# Patient Record
Sex: Male | Born: 1960 | Hispanic: No | Smoking: Never smoker
Health system: Southern US, Community
[De-identification: ages and names within clinical notes are randomized; demographics above are authoritative.]

## PROBLEM LIST (undated history)

## (undated) DIAGNOSIS — K649 Unspecified hemorrhoids: Secondary | ICD-10-CM

## (undated) HISTORY — DX: Unspecified hemorrhoids: K64.9

---

## 2009-09-30 ENCOUNTER — Emergency Department (HOSPITAL_COMMUNITY): Admission: EM | Admit: 2009-09-30 | Discharge: 2009-09-30 | Payer: Self-pay | Admitting: Emergency Medicine

## 2010-09-20 LAB — BASIC METABOLIC PANEL
BUN: 9 mg/dL (ref 6–23)
CO2: 28 mEq/L (ref 19–32)
Chloride: 106 mEq/L (ref 96–112)
GFR calc Af Amer: >60 mL/min (ref >60)
GFR calc non Af Amer: >60 mL/min (ref >60)
Glucose, Bld: 112 mg/dL — ABNORMAL HIGH (ref 70–99)
Potassium: 4.2 mEq/L (ref 3.5–5.1)
Sodium: 139 mEq/L (ref 135–145)

## 2010-09-20 LAB — DIFFERENTIAL
Basophils Relative: 1 % (ref 0–1)
Lymphocytes Relative: 33 % (ref 12–46)
Lymphs Abs: 0.9 10*3/uL (ref 0.7–4.0)
Monocytes Absolute: 0.4 10*3/uL (ref 0.1–1.0)
Monocytes Relative: 13 % — ABNORMAL HIGH (ref 3–12)
Neutro Abs: 1.4 10*3/uL — ABNORMAL LOW (ref 1.7–7.7)
Neutrophils Relative %: 50 % (ref 43–77)

## 2010-09-20 LAB — CBC
HCT: 39.7 % (ref 39.0–52.0)
Hemoglobin: 13.3 g/dL (ref 13.0–17.0)
MCV: 85.7 fL (ref 78.0–100.0)
RDW: 13.2 % (ref 11.5–15.5)
WBC: 2.8 10*3/uL — ABNORMAL LOW (ref 4.0–10.5)

## 2010-09-20 LAB — CK TOTAL AND CKMB (NOT AT ARMC): Relative Index: 0.8 (ref 0.0–2.5)

## 2011-08-03 IMAGING — CT CT ANGIO CHEST
2 of 6 series · 19 of 36 positions shown · IV contrast (APPLIED)
Comparison: None

CLINICAL DATA: Chest pain, question aortic dissection or aneurysm

CT ANGIOGRAPHY CHEST WITH CONTRAST
TECHNIQUE: Multidetector CT imaging of the chest was performed
using the standard protocol during bolus administration of
intravenous contrast.  Multiplanar CT image reconstructions
including MIPs were obtained to evaluate the vascular anatomy.
Breast shield utilized.
Contrast:  100 ml [id number] IV

[Series 8: pulm embolism 1.0 b25f thins · axial · 0.63mm/px · z∈[+1027,+1354]mm · 18 of 365 slices shown]
[im 19/365  lung]
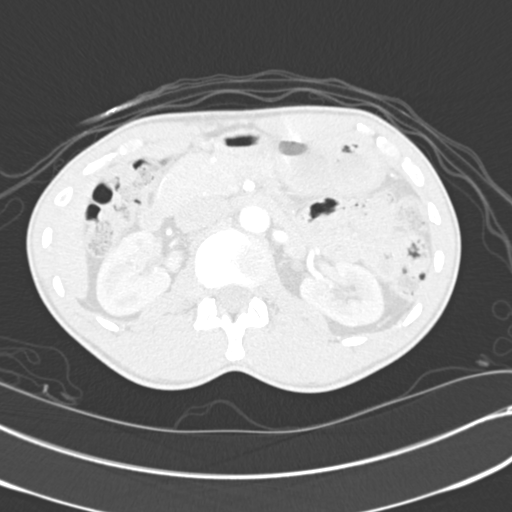
[im 37/365  mediastinal]
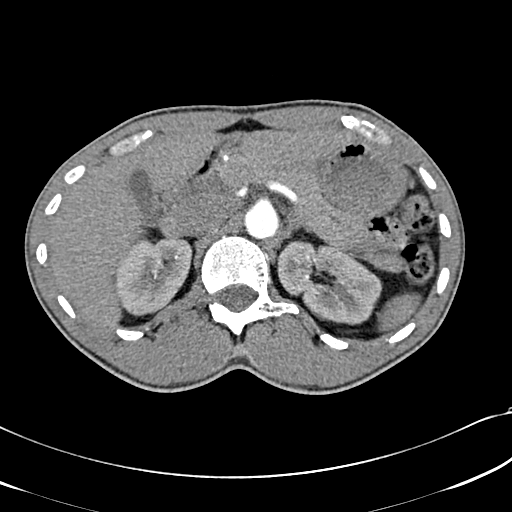
[im 55/365  lung]
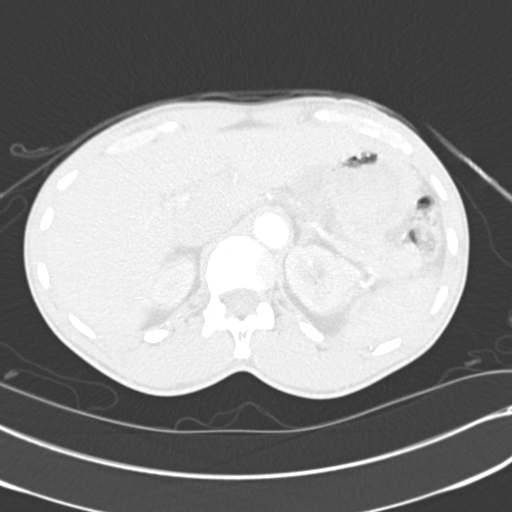
[im 73/365  mediastinal]
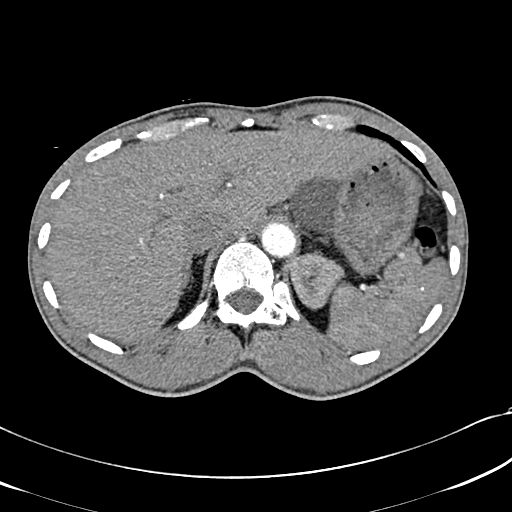
[im 92/365  lung]
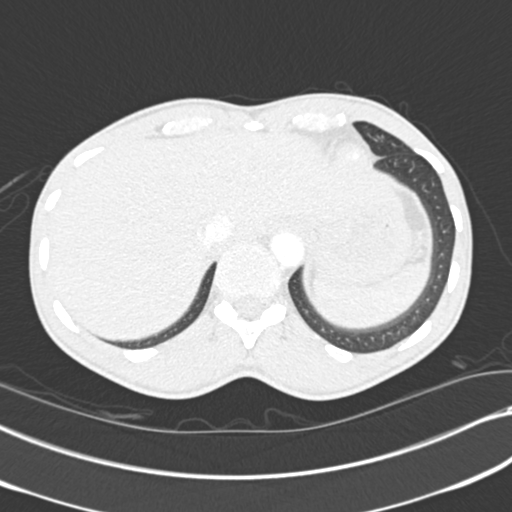
[im 110/365  mediastinal]
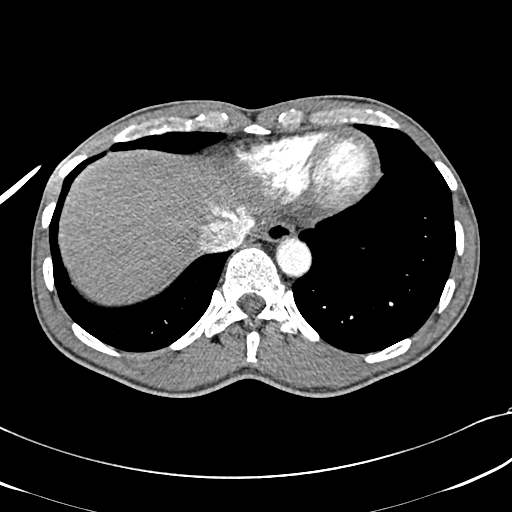
[im 128/365  lung]
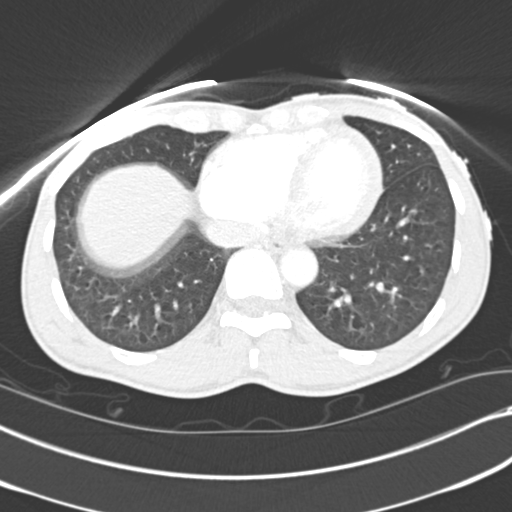
[im 146/365  mediastinal]
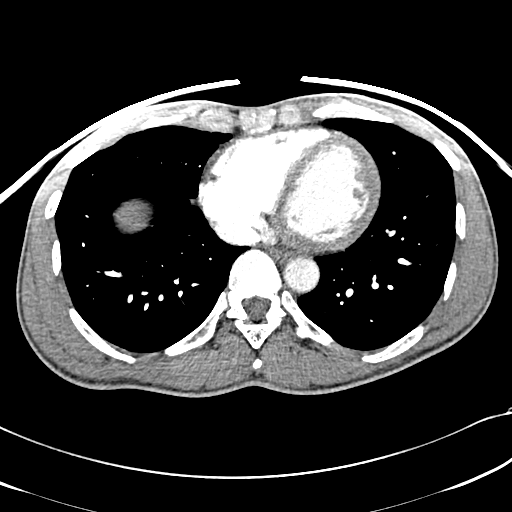
[im 164/365  lung]
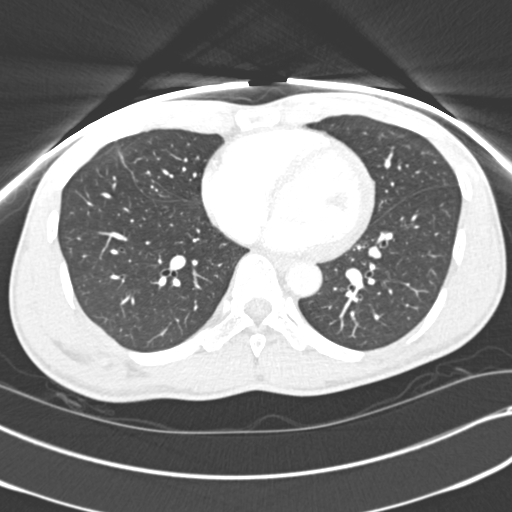
[im 201/365  mediastinal]
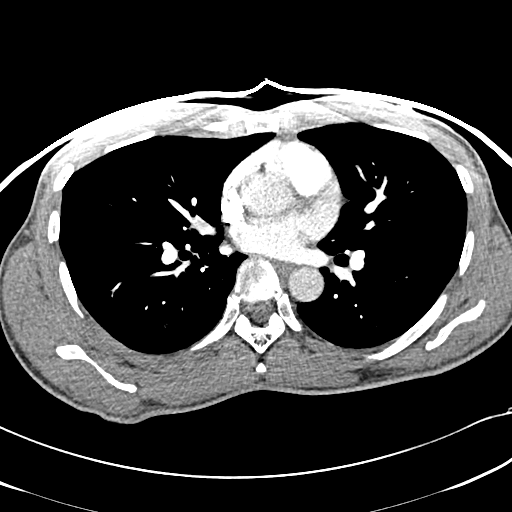
[im 219/365  lung]
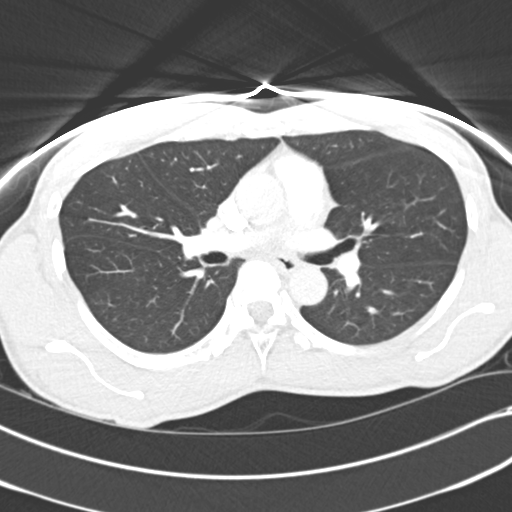
[im 237/365  mediastinal]
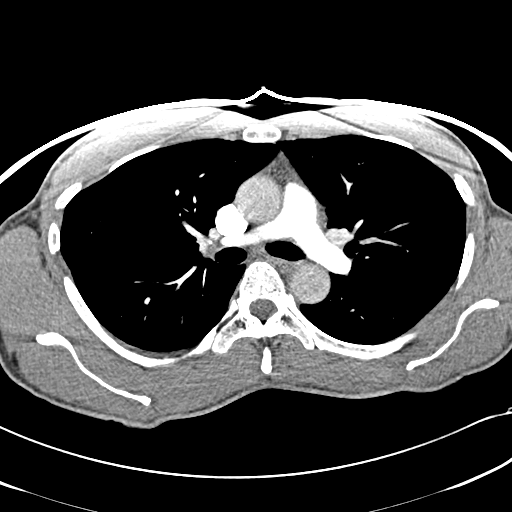
[im 255/365  lung]
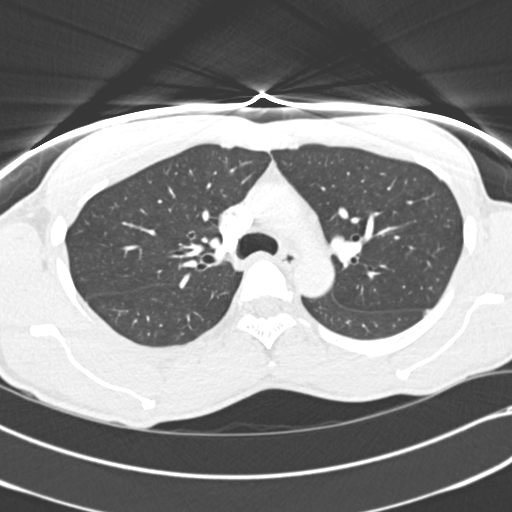
[im 274/365  mediastinal]
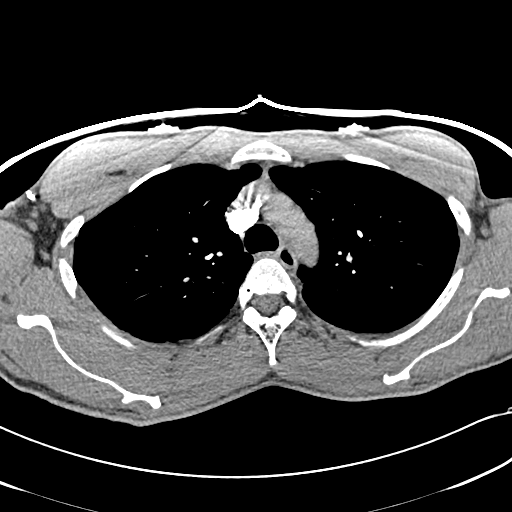
[im 292/365  lung]
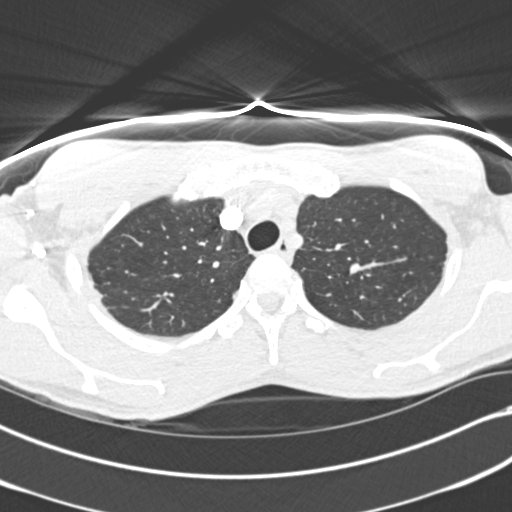
[im 310/365  mediastinal]
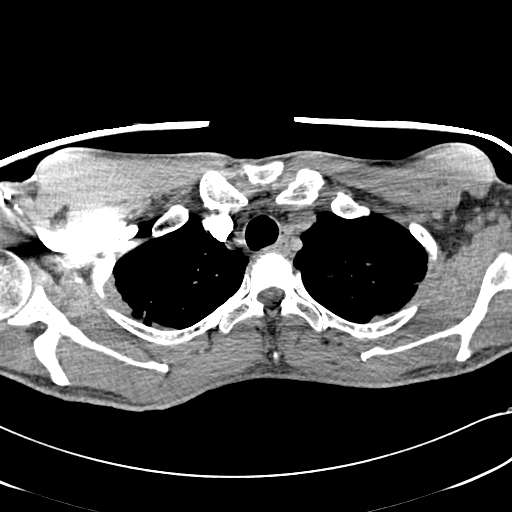
[im 328/365  lung]
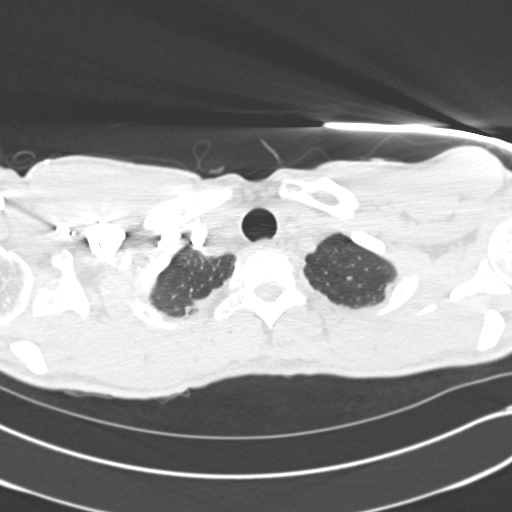
[im 346/365  mediastinal]
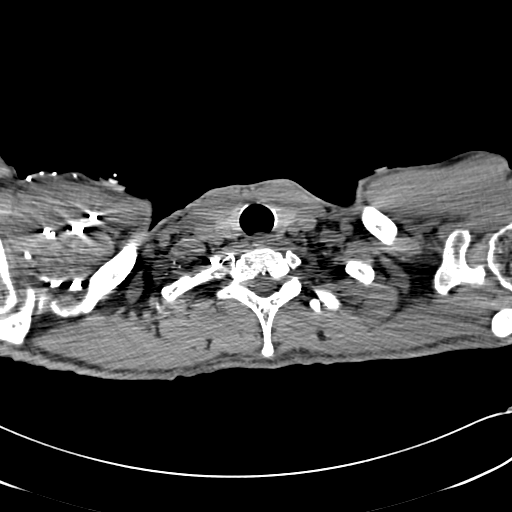

[Series 602: coronal · coronal · 0.71mm/px · 1 of 81 slices shown]
[im 41/81  mediastinal]
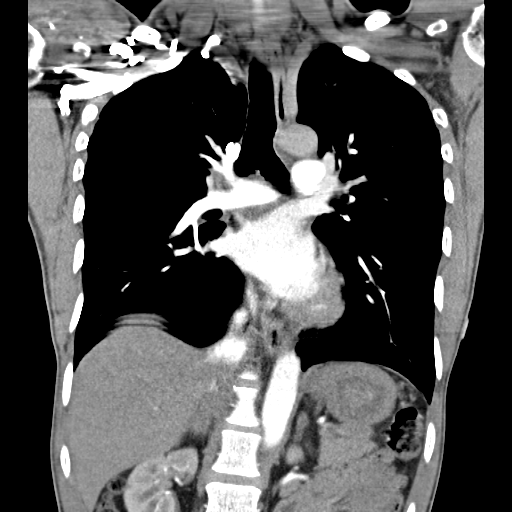

[19 of 36 positions shown; findings below may reference images not displayed]

FINDINGS: Better pulmonary arterial opacification than aortic opacification.
However, aorta is adequately opacified and appears normal in
caliber without gross evidence of aneurysm or dissection.
Pulmonary arteries well opacified and patent.
No evidence pulmonary embolism.
Visualized portion of upper abdomen normal.
Minimal peripheral scarring at lung apices.
Lungs otherwise clear.
No pleural effusion or pneumothorax.
No acute bony findings.

Review of the MIP images confirms the above findings.
IMPRESSION: Negative CTA chest.
Minimal biapical scarring.

## 2019-05-06 ENCOUNTER — Other Ambulatory Visit: Payer: Self-pay

## 2019-05-06 DIAGNOSIS — Z20822 Contact with and (suspected) exposure to covid-19: Secondary | ICD-10-CM

## 2019-05-07 LAB — NOVEL CORONAVIRUS, NAA: SARS-CoV-2, NAA: NOT DETECTED

## 2019-10-14 ENCOUNTER — Ambulatory Visit: Payer: Self-pay | Attending: Internal Medicine

## 2019-10-14 DIAGNOSIS — Z20822 Contact with and (suspected) exposure to covid-19: Secondary | ICD-10-CM

## 2019-10-15 LAB — SARS-COV-2, NAA 2 DAY TAT

## 2019-10-15 LAB — NOVEL CORONAVIRUS, NAA: SARS-CoV-2, NAA: NOT DETECTED

## 2019-12-22 ENCOUNTER — Ambulatory Visit (INDEPENDENT_AMBULATORY_CARE_PROVIDER_SITE_OTHER): Payer: PRIVATE HEALTH INSURANCE | Admitting: Podiatry

## 2019-12-22 ENCOUNTER — Encounter: Payer: Self-pay | Admitting: Podiatry

## 2019-12-22 ENCOUNTER — Other Ambulatory Visit: Payer: Self-pay

## 2019-12-22 VITALS — BP 123/71 | HR 64

## 2019-12-22 DIAGNOSIS — L84 Corns and callosities: Secondary | ICD-10-CM

## 2019-12-22 DIAGNOSIS — M79672 Pain in left foot: Secondary | ICD-10-CM

## 2019-12-22 NOTE — Patient Instructions (Addendum)
Both can be purchased on [employer]:  Felt callus pads (U-shape or Circular); make sure they're non-medicated and does not contain any type of acid.  Dr. Jill's Gel Callus Cushion (blue; self-sticking and reusable)  Corns and Calluses Corns are small areas of thickened skin that occur on the top, sides, or tip of a toe. They contain a cone-shaped core with a point that can press on a nerve below. This causes pain.  Calluses are areas of thickened skin that can occur anywhere on the body, including the hands, fingers, palms, soles of the feet, and heels. Calluses are usually larger than corns. What are the causes? Corns and calluses are caused by rubbing (friction) or pressure, such as from shoes that are too tight or do not fit properly. What increases the risk? Corns are more likely to develop in people who have misshapen toes (toe deformities), such as hammer toes. Calluses can occur with friction to any area of the skin. They are more likely to develop in people who:  Work with their hands.  Wear shoes that fit poorly, are too tight, or are high-heeled.  Have toe deformities. What are the signs or symptoms? Symptoms of a corn or callus include:  A hard growth on the skin.  Pain or tenderness under the skin.  Redness and swelling.  Increased discomfort while wearing tight-fitting shoes, if your feet are affected. If a corn or callus becomes infected, symptoms may include:  Redness and swelling that gets worse.  Pain.  Fluid, blood, or pus draining from the corn or callus. How is this diagnosed? Corns and calluses may be diagnosed based on your symptoms, your medical history, and a physical exam. How is this treated? Treatment for corns and calluses may include:  Removing the cause of the friction or pressure. This may involve: ? Changing your shoes. ? Wearing shoe inserts (orthotics) or other protective layers in your shoes, such as a corn pad. ? Wearing gloves.  Applying  medicine to the skin (topical medicine) to help soften skin in the hardened, thickened areas.  Removing layers of dead skin with a file to reduce the size of the corn or callus.  Removing the corn or callus with a scalpel or laser.  Taking antibiotic medicines, if your corn or callus is infected.  Having surgery, if a toe deformity is the cause. Follow these instructions at home:   Take over-the-counter and prescription medicines only as told by your health care provider.  If you were prescribed an antibiotic, take it as told by your health care provider. Do not stop taking it even if your condition starts to improve.  Wear shoes that fit well. Avoid wearing high-heeled shoes and shoes that are too tight or too loose.  Wear any padding, protective layers, gloves, or orthotics as told by your health care provider.  Soak your hands or feet and then use a file or pumice stone to soften your corn or callus. Do this as told by your health care provider.  Check your corn or callus every day for symptoms of infection. Contact a health care provider if you:  Notice that your symptoms do not improve with treatment.  Have redness or swelling that gets worse.  Notice that your corn or callus becomes painful.  Have fluid, blood, or pus coming from your corn or callus.  Have new symptoms. Summary  Corns are small areas of thickened skin that occur on the top, sides, or tip of a toe.    Calluses are areas of thickened skin that can occur anywhere on the body, including the hands, fingers, palms, and soles of the feet. Calluses are usually larger than corns.  Corns and calluses are caused by rubbing (friction) or pressure, such as from shoes that are too tight or do not fit properly.  Treatment may include wearing any padding, protective layers, gloves, or orthotics as told by your health care provider. This information is not intended to replace advice given to you by your health care  provider. Make sure you discuss any questions you have with your health care provider. Document Revised: 10/08/2018 Document Reviewed: 05/01/2017 [employer] Patient Education  2020 [employer]  

## 2019-12-27 ENCOUNTER — Encounter: Payer: Self-pay | Admitting: Podiatry

## 2019-12-27 NOTE — Progress Notes (Signed)
Subjective: Corey Chavez presents today for complaint of painful plantar lesion left foot.  Pain prevent comfortable ambulation. Condition has been present for years.  Aggravating factor is weightbearing with or without shoegear.  Past Medical History:  Diagnosis Date  . Hemorrhoids      There are no problems to display for this patient.    History reviewed. No pertinent surgical history.   Current Outpatient Medications on File Prior to Visit  Medication Sig Dispense Refill  . hydrocortisone (ANUSOL-HC) 2.5 % rectal cream Place rectally 2 (two) times daily as needed.    . predniSONE (DELTASONE) 20 MG tablet Take by mouth.     No current facility-administered medications on file prior to visit.     No Known Allergies   Social History   Occupational History  . Not on file  Tobacco Use  . Smoking status: Never Smoker  . Smokeless tobacco: Never Used  Substance and Sexual Activity  . Alcohol use: Not on file  . Drug use: Not on file  . Sexual activity: Not on file     History reviewed. No pertinent family history.    There is no immunization history on file for this patient.   Objective: Corey Chavez is a/an 59 y.o. male WD, WN in NAD.Marland Kitchen AAO x 3. Vitals:   12/22/19 1022  BP: 123/71  Pulse: 64    Vascular Examination:  Neurovascular status intact b/l lower extremities. Capillary refill time to digits immediate b/l. Palpable pedal pulses b/l LE. Pedal hair present. Lower extremity skin temperature gradient within normal limits. No pain with calf compression b/l.  Dermatological Examination: Pedal skin with normal turgor, texture and tone bilaterally. No open wounds bilaterally. No interdigital macerations bilaterally. Hyperkeratotic lesion(s) submet head 2 left foot.  No erythema, no edema, no drainage, no flocculence.  Musculoskeletal: Normal muscle strength 5/5 to all lower extremity muscle groups bilaterally. No pain crepitus or joint limitation noted with  ROM b/l. No gross bony deformities bilaterally.  Neurological: Protective sensation intact 5/5 intact bilaterally with 10g monofilament b/l. Vibratory sensation intact b/l. Proprioception intact bilaterally. Deep tendon reflexes normal b/l.  Babinski reflex negative b/l.  Assessment: 1. Callus   2. Left foot pain    Plan: -Examined patient. -Callus(es) submet head 2 left foot pared utilizing sterile scalpel blade without complication or incident. Total number debrided =1. -Patient to report any pedal injuries to medical professional immediately. -Patient to continue soft, supportive shoe gear daily. -Patient/POA to call should there be question/concern in the interim.  Return if symptoms worsen or fail to improve, for callus trim.

## 2020-02-29 ENCOUNTER — Other Ambulatory Visit: Payer: Self-pay | Admitting: Family Medicine

## 2020-02-29 ENCOUNTER — Ambulatory Visit
Admission: RE | Admit: 2020-02-29 | Discharge: 2020-02-29 | Disposition: A | Discharge status: Home / Self Care | Payer: PRIVATE HEALTH INSURANCE | Source: Ambulatory Visit | Attending: Family Medicine | Admitting: Family Medicine

## 2020-02-29 DIAGNOSIS — R0600 Dyspnea, unspecified: Secondary | ICD-10-CM

## 2020-03-29 ENCOUNTER — Ambulatory Visit (INDEPENDENT_AMBULATORY_CARE_PROVIDER_SITE_OTHER): Payer: PRIVATE HEALTH INSURANCE | Admitting: Podiatry

## 2020-03-29 ENCOUNTER — Encounter: Payer: Self-pay | Admitting: Podiatry

## 2020-03-29 ENCOUNTER — Other Ambulatory Visit: Payer: Self-pay

## 2020-03-29 DIAGNOSIS — M79672 Pain in left foot: Secondary | ICD-10-CM

## 2020-03-29 DIAGNOSIS — Q828 Other specified congenital malformations of skin: Secondary | ICD-10-CM

## 2020-03-29 NOTE — Patient Instructions (Signed)
Purchase felt callus pads or horse shoe pads on [email or web address].  You may also purchase reusable gel horse shoe pads as well on that site.

## 2020-03-31 ENCOUNTER — Encounter: Payer: Self-pay | Admitting: Podiatry

## 2020-03-31 NOTE — Progress Notes (Signed)
  Subjective:  Patient ID: Corey Chavez, male    DOB: [birth year],  MRN: [id number]  59 y.o. male presents with painful porokeratotic lesions left foot.  Pain prevent comfortable ambulation. Aggravating factor is weightbearing with or without shoegear..    Review of Systems: Negative except as noted in the HPI.  Past Medical History:  Diagnosis Date  . Hemorrhoids    History reviewed. No pertinent surgical history. There are no problems to display for this patient.   Current Outpatient Medications:  .  hydrocortisone (ANUSOL-HC) 2.5 % rectal cream, Place rectally 2 (two) times daily as needed., Disp: , Rfl:  .  predniSONE (DELTASONE) 20 MG tablet, Take by mouth., Disp: , Rfl:  .  triamcinolone cream (KENALOG) 0.1 %, SMARTSIG:Sparingly Topical Twice Daily PRN, Disp: , Rfl:  No Known Allergies Social History   Occupational History  . Not on file  Tobacco Use  . Smoking status: Never Smoker  . Smokeless tobacco: Never Used  Substance and Sexual Activity  . Alcohol use: Not on file  . Drug use: Not on file  . Sexual activity: Not on file    Objective:   Constitutional Pt is a pleasant 58 y.o.male WD, WN in NAD.Marland Kitchen AAO x 3.   Vascular Capillary refill time to digits immediate b/l. Palpable pedal pulses b/l LE. Pedal hair present. Lower extremity skin temperature gradient within normal limits. No pain with calf compression b/l. No edema noted b/l lower extremities. No cyanosis or clubbing noted.  Neurologic Normal speech. Oriented to person, place, and time. Protective sensation intact 5/5 intact bilaterally with 10g monofilament b/l. Vibratory sensation intact b/l. Proprioception intact bilaterally.  Dermatologic Pedal skin with normal turgor, texture and tone bilaterally. No open wounds bilaterally. No interdigital macerations bilaterally. Toenails 1-5 b/l well maintained with adequate length. No erythema, no edema, no drainage, no flocculence. Porokeratotic lesion(s) submet head  2 left foot. No erythema, no edema, no drainage, no flocculence.  Orthopedic: Normal muscle strength 5/5 to all lower extremity muscle groups bilaterally. No pain crepitus or joint limitation noted with ROM b/l. No gross bony deformities bilaterally. Patient ambulates independent of any assistive aids.   Radiographs: None Assessment:   1. Porokeratosis   2. Left foot pain    Plan:  Patient was evaluated and treated and all questions answered. -Examined patient. -No new findings. No new orders. -Painful porokeratotic lesion(s) submet head 2 left foot pared and enucleated with sterile scalpel blade without incident. -Patient to report any pedal injuries to medical professional immediately. -Patient to continue soft, supportive shoe gear daily. -Patient/POA to call should there be question/concern in the interim.  Return if symptoms worsen or fail to improve.  Freddie Breech, DPM

## 2020-04-07 ENCOUNTER — Other Ambulatory Visit: Payer: PRIVATE HEALTH INSURANCE

## 2020-04-07 DIAGNOSIS — Z20822 Contact with and (suspected) exposure to covid-19: Secondary | ICD-10-CM

## 2020-04-08 LAB — NOVEL CORONAVIRUS, NAA: SARS-CoV-2, NAA: NOT DETECTED

## 2020-04-08 LAB — SARS-COV-2, NAA 2 DAY TAT

## 2020-04-11 ENCOUNTER — Other Ambulatory Visit: Payer: PRIVATE HEALTH INSURANCE

## 2020-04-11 DIAGNOSIS — Z20822 Contact with and (suspected) exposure to covid-19: Secondary | ICD-10-CM

## 2020-04-13 LAB — NOVEL CORONAVIRUS, NAA: SARS-CoV-2, NAA: NOT DETECTED

## 2020-04-13 LAB — SARS-COV-2, NAA 2 DAY TAT

## 2020-06-20 ENCOUNTER — Other Ambulatory Visit: Payer: PRIVATE HEALTH INSURANCE

## 2020-09-13 ENCOUNTER — Ambulatory Visit: Payer: No Typology Code available for payment source | Attending: Critical Care Medicine

## 2020-09-13 DIAGNOSIS — Z20822 Contact with and (suspected) exposure to covid-19: Secondary | ICD-10-CM

## 2020-09-14 LAB — NOVEL CORONAVIRUS, NAA: SARS-CoV-2, NAA: NOT DETECTED

## 2020-09-14 LAB — SARS-COV-2, NAA 2 DAY TAT

## 2020-12-26 ENCOUNTER — Ambulatory Visit: Payer: No Typology Code available for payment source | Attending: Internal Medicine

## 2020-12-26 DIAGNOSIS — Z20822 Contact with and (suspected) exposure to covid-19: Secondary | ICD-10-CM

## 2020-12-27 LAB — SPECIMEN STATUS REPORT

## 2020-12-27 LAB — SARS-COV-2, NAA 2 DAY TAT

## 2020-12-27 LAB — NOVEL CORONAVIRUS, NAA: SARS-CoV-2, NAA: NOT DETECTED

## 2022-01-01 IMAGING — DX DG CHEST 2V
2 series · 2 of 2 positions shown · non-contrast
Comparison: CT chest and radiograph 09/30/2009

CLINICAL DATA: Dyspnea

EXAM:
CHEST - 2 VIEW

[dg chest 2 view (1 of 2)]
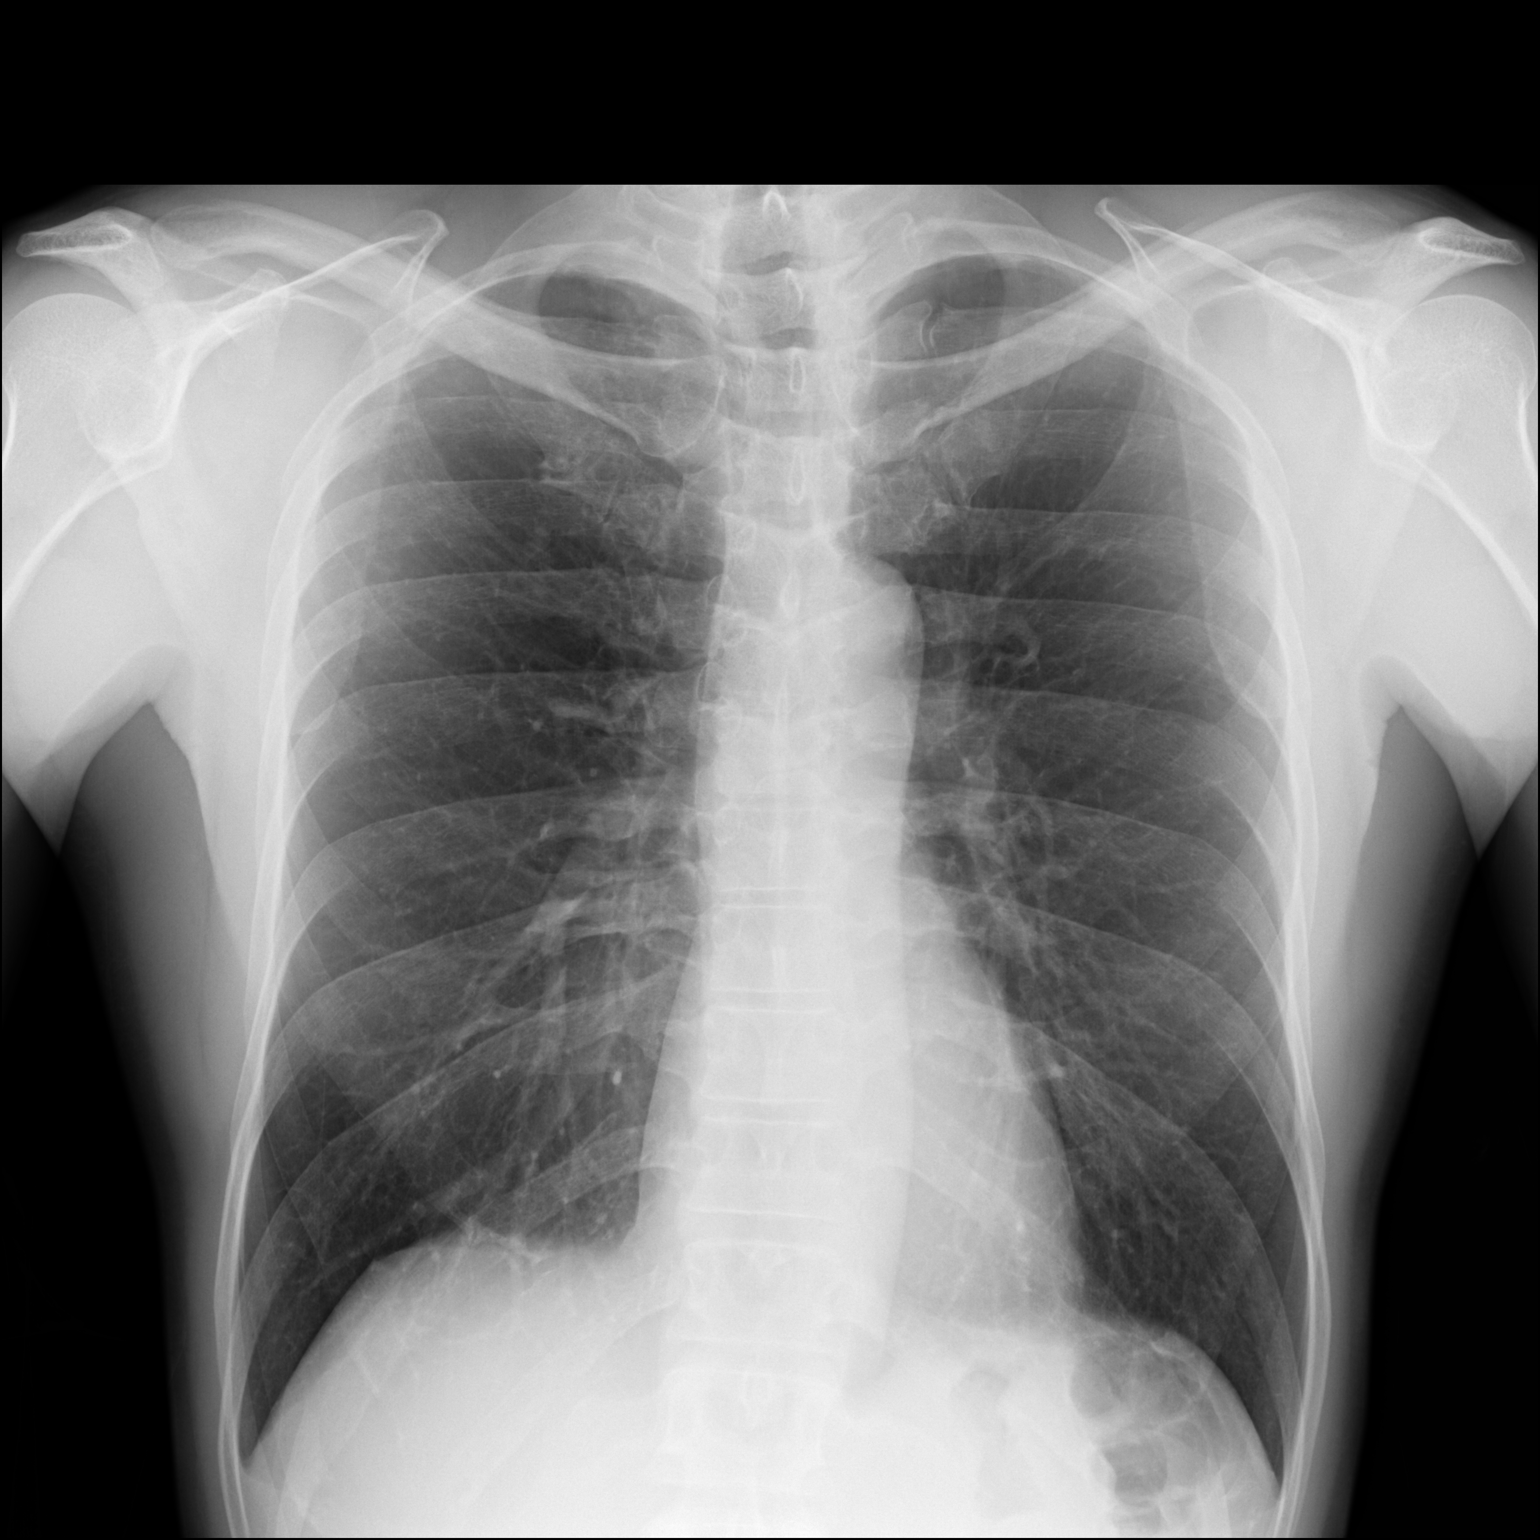

[dg chest 2 view (2 of 2)]
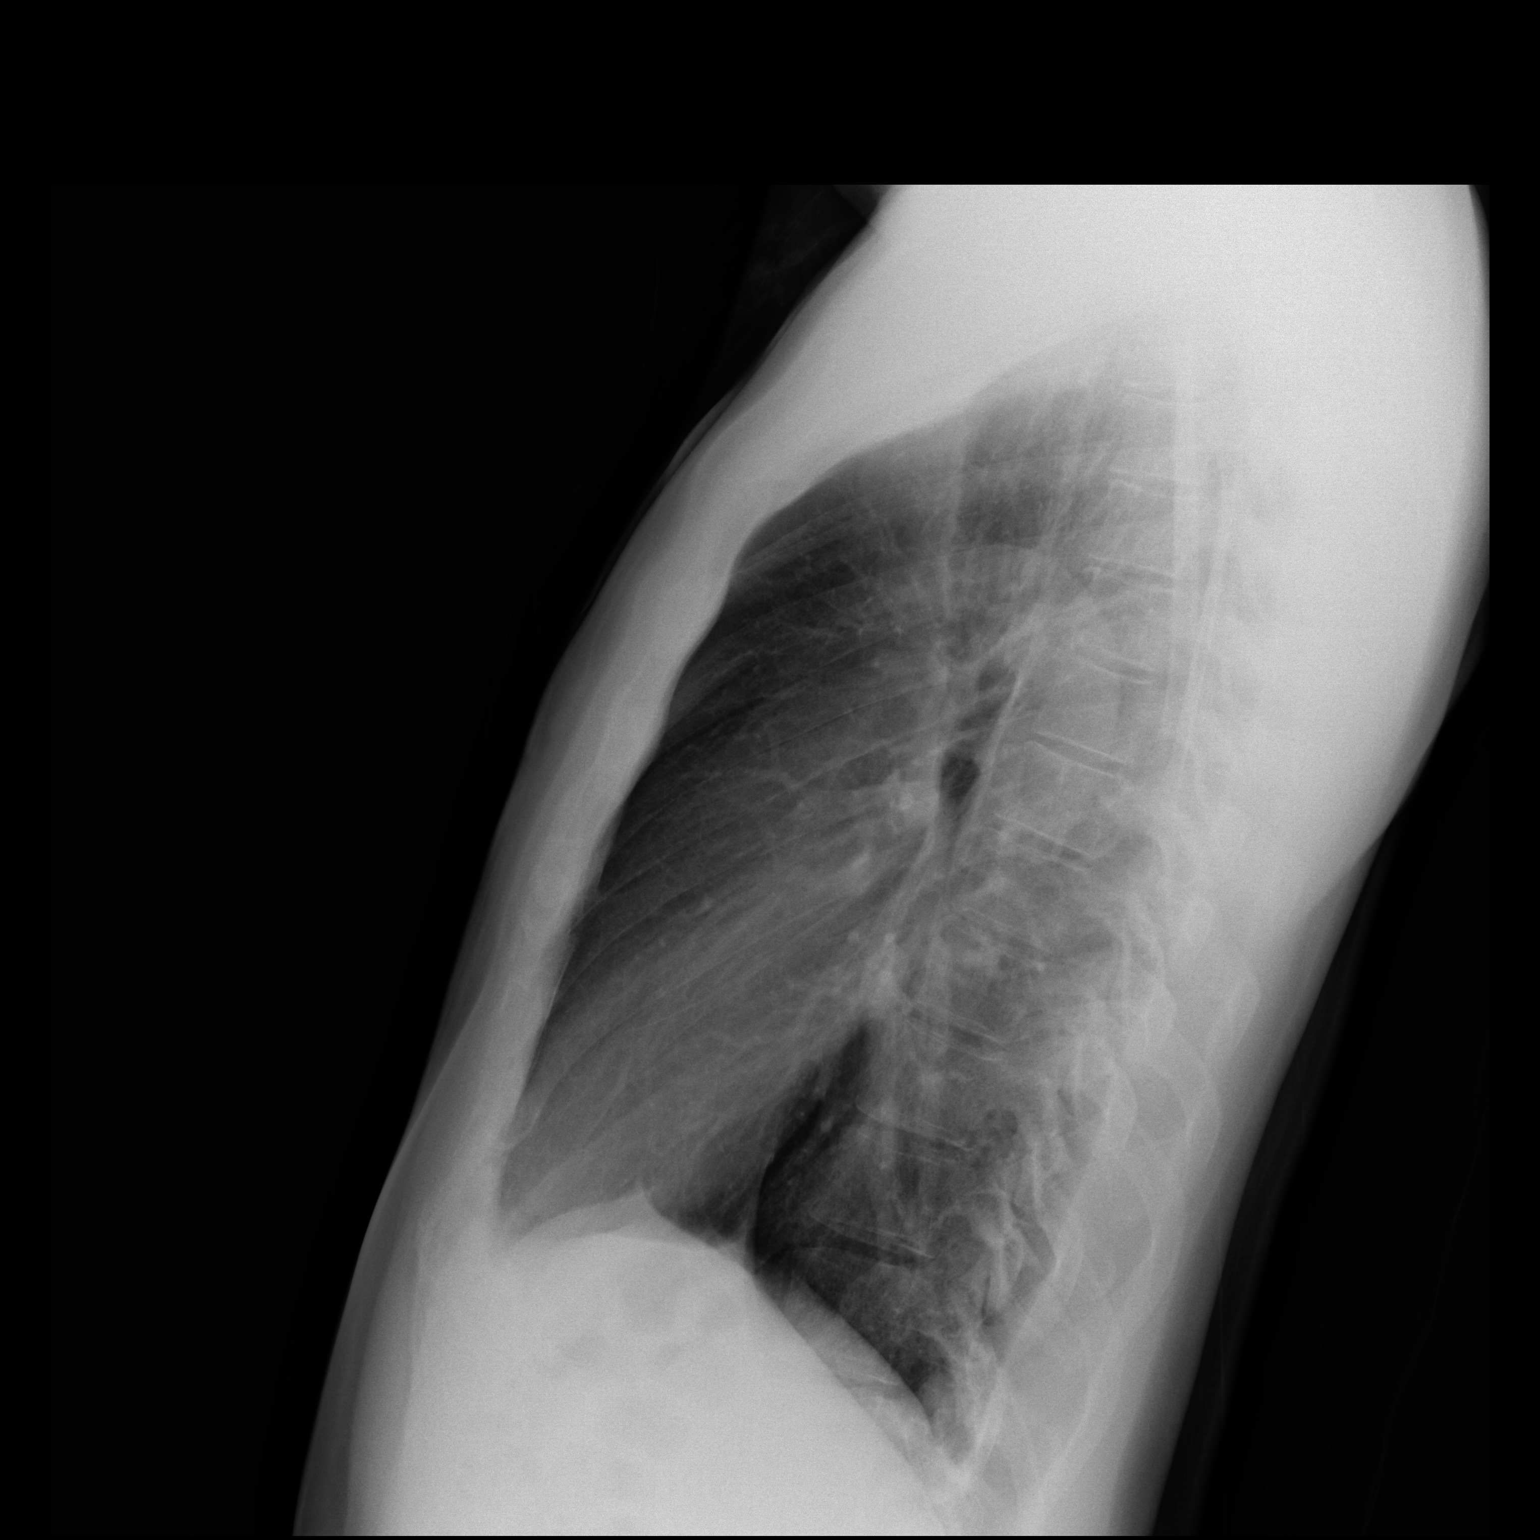

[2 of 2 positions shown; findings below may reference images not displayed]

FINDINGS: The heart size and mediastinal contours are within normal limits.
Both lungs are clear. The visualized skeletal structures are
unremarkable.
IMPRESSION: No active cardiopulmonary disease.

## 2023-01-31 NOTE — Progress Notes (Signed)
[facility] Cancer Initial Visit:  Patient Care Team: [name], L.August Saucer, MD as PCP - General (Family Medicine) Loni Muse, MD as Consulting Physician (Hematology)  CHIEF COMPLAINTS/PURPOSE OF CONSULTATION: HISTORY OF PRESENTING ILLNESS:  Corey Chavez 63 y.o. male is here because of leukopenia Medical history notable for eczema, colon polyps, vitamin D deficiency  Nov 15, 2022: B12 272 ferritin 76 HIV negative  December 25, 2022: WBC 3.1 hemoglobin 13.1 MCV 85 platelet count 167; 48 segs 28 lymphs 12 monos 10 eos 2 basos.  ANC 1.5  February 01 2023:  [facility] Hematology Patient states that over the last year has noted increased fatigue and skin feels hot to touch.  No history of recurrent infections.    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  Tobacco none,  EtOH none  FMH Mother died 68 MDS Father died 58 prostate cancer Brother died 71 ESRD due to HTN Brother alive 67 well Brother alive 41 well  Brother alive 20 well Sister alive 51 well   Review of Systems  Constitutional:  Negative for appetite change, chills, fatigue, fever and unexpected weight change.  HENT:   Negative for mouth sores, nosebleeds, sore throat, trouble swallowing and voice change.   Eyes:  Negative for eye problems and icterus.       Vision changes:  None  Respiratory:  Negative for chest tightness, cough, hemoptysis, shortness of breath and wheezing.        PND:  none Orthopnea:  none DOE:    Cardiovascular:  Negative for chest pain and leg swelling.       Has experienced palpitations in the past but not in 6 months  Gastrointestinal:  Negative for abdominal pain, blood in stool, diarrhea, nausea and vomiting.       Has occasional anal pruritus.  Has undergone colonoscopy in the past.  Occasional constipation  Endocrine: Negative for hot flashes.       Cold intolerance:  none Heat intolerance:  none  Genitourinary:  Negative for bladder incontinence, difficulty urinating, dysuria,  frequency, hematuria and nocturia.   Musculoskeletal:  Negative for arthralgias, back pain, gait problem, myalgias, neck pain and neck stiffness.  Skin:  Negative for itching, rash and wound.  Neurological:  Negative for dizziness, extremity weakness, gait problem, headaches, numbness, seizures and speech difficulty.  Hematological:  Negative for adenopathy. Does not bruise/bleed easily.  Psychiatric/Behavioral:  Negative for sleep disturbance and suicidal ideas. The patient is not nervous/anxious.        Does not sleep enough    MEDICAL HISTORY: Past Medical History:  Diagnosis Date   Hemorrhoids     SURGICAL HISTORY: No past surgical history on file.  SOCIAL HISTORY: Social History   Socioeconomic History   Marital status: Unknown    Spouse name: Not on file   Number of children: Not on file   Years of education: Not on file   Highest education level: Not on file  Occupational History   Not on file  Tobacco Use   Smoking status: Never   Smokeless tobacco: Never  Substance and Sexual Activity   Alcohol use: Not on file   Drug use: Not on file   Sexual activity: Not on file  Other Topics Concern   Not on file  Social History Narrative   Not on file   Social Determinants of Health   Financial Resource Strain: Not on file  Food Insecurity: Not on file  Transportation Needs: Not on file  Physical Activity: Not  on file  Stress: Not on file  Social Connections: Not on file  Intimate Partner Violence: Not on file    FAMILY HISTORY No family history on file.  ALLERGIES:  has No Known Allergies.  MEDICATIONS:  Current Outpatient Medications  Medication Sig Dispense Refill   Vitamin D, Ergocalciferol, (DRISDOL) 1.25 MG (50000 UNIT) CAPS capsule Take 50,000 Units by mouth once a week.     hydrocortisone (ANUSOL-HC) 2.5 % rectal cream Place rectally 2 (two) times daily as needed. (Patient not taking: Reported on 02/01/2023)     predniSONE (DELTASONE) 20 MG tablet Take  by mouth. (Patient not taking: Reported on 02/01/2023)     triamcinolone cream (KENALOG) 0.1 % SMARTSIG:Sparingly Topical Twice Daily PRN (Patient not taking: Reported on 02/01/2023)     No current facility-administered medications for this visit.    PHYSICAL EXAMINATION:  ECOG PERFORMANCE STATUS: 0 - Asymptomatic   Vitals:   02/01/23 1503  BP: 118/83  Pulse: (!) 59  Resp: 18  Temp: 97.8 F (36.6 C)  SpO2: 100%    Filed Weights   02/01/23 1503  Weight: 149 lb 11.2 oz (67.9 kg)     Physical Exam Vitals and nursing note reviewed.  Constitutional:      General: He is not in acute distress.    Appearance: Normal appearance. He is not ill-appearing or diaphoretic.  HENT:     Head: Normocephalic and atraumatic.     Right Ear: External ear normal.     Left Ear: External ear normal.     Nose: Nose normal.  Eyes:     General: No scleral icterus.    Conjunctiva/sclera: Conjunctivae normal.     Pupils: Pupils are equal, round, and reactive to light.  Cardiovascular:     Rate and Rhythm: Normal rate and regular rhythm.     Heart sounds:     No friction rub. No gallop.  Pulmonary:     Effort: Pulmonary effort is normal.     Breath sounds: Normal breath sounds. No wheezing or rales.  Abdominal:     General: Abdomen is flat.     Tenderness: There is no abdominal tenderness. There is no guarding or rebound.  Musculoskeletal:        General: No swelling or deformity. Normal range of motion.     Cervical back: Normal range of motion and neck supple. No rigidity or tenderness.     Right lower leg: No edema.     Left lower leg: No edema.  Lymphadenopathy:     Head:     Right side of head: No submental, submandibular, tonsillar, preauricular, posterior auricular or occipital adenopathy.     Left side of head: No submental, submandibular, tonsillar, preauricular, posterior auricular or occipital adenopathy.     Cervical: No cervical adenopathy.     Right cervical: No superficial,  deep or posterior cervical adenopathy.    Left cervical: No superficial, deep or posterior cervical adenopathy.     Upper Body:     Right upper body: No supraclavicular or axillary adenopathy.     Left upper body: No supraclavicular or axillary adenopathy.     Lower Body: No right inguinal adenopathy. No left inguinal adenopathy.  Skin:    Coloration: Skin is not jaundiced.     Findings: No bruising, erythema, lesion or rash.  Neurological:     General: No focal deficit present.     Mental Status: He is alert and oriented to person, place, and time.  Cranial Nerves: No cranial nerve deficit.     Motor: No weakness.  Psychiatric:        Mood and Affect: Mood normal.        Behavior: Behavior normal.        Thought Content: Thought content normal.        Judgment: Judgment normal.      LABORATORY DATA: I have personally reviewed the data as listed:  Appointment on 02/01/2023  Component Date Value Ref Range Status   Hepatitis B Surface Ag 02/01/2023 NON REACTIVE  NON REACTIVE Final   HCV Ab 02/01/2023 NON REACTIVE  NON REACTIVE Final   Comment: (NOTE) Nonreactive HCV antibody screen is consistent with no HCV infections,  unless recent infection is suspected or other evidence exists to indicate HCV infection.     Hep A IgM 02/01/2023 NON REACTIVE  NON REACTIVE Final   Hep B C IgM 02/01/2023 NON REACTIVE  NON REACTIVE Final   Performed at [facility], [address], [place], [place] [address]   Rheumatoid fact SerPl-aCnc 02/01/2023 11.7  <14.0 IU/mL Final   Comment: (NOTE) Performed At: [facility] [address] [place], [place] [id number] Jolene Schimke MD KG:[id number]    ds DNA Ab 02/01/2023 <1  0 - 9 IU/mL Final   Comment: (NOTE)                                   Negative      <5                                   Equivocal  5 - 9                                   Positive      >9    Ribonucleic Protein 02/01/2023 <0.2  0.0 - 0.9 AI Final   ENA  SM Ab Ser-aCnc 02/01/2023 <0.2  0.0 - 0.9 AI Final   Scleroderma (Scl-70) (ENA) Antibod* 02/01/2023 <0.2  0.0 - 0.9 AI Final   SSA (Ro) (ENA) Antibody, IgG 02/01/2023 <0.2  0.0 - 0.9 AI Final   SSB (La) (ENA) Antibody, IgG 02/01/2023 <0.2  0.0 - 0.9 AI Final   Chromatin Ab SerPl-aCnc 02/01/2023 <0.2  0.0 - 0.9 AI Final   Anti JO-1 02/01/2023 <0.2  0.0 - 0.9 AI Final   Centromere Ab Screen 02/01/2023 <0.2  0.0 - 0.9 AI Final   See below: 02/01/2023 Comment   Final   Comment: (NOTE) Autoantibody                       Disease Association ------------------------------------------------------------                        Condition                  Frequency ---------------------   ------------------------   --------- Antinuclear Antibody,    SLE, mixed connective Direct (ANA-D)           tissue diseases ---------------------   ------------------------   --------- dsDNA                    SLE  40 - 60% ---------------------   ------------------------   --------- Chromatin                Drug induced SLE                90%                         SLE                        48 - 97% ---------------------   ------------------------   --------- SSA (Ro)                 SLE                        25 - 35%                         Sjogren's Syndrome         40 - 70%                         Neonatal Lupus                 100% ---------------------   ------------------------   --------- SSB (La)                 SLE                                                       10%                         Sjogren's Syndrome              30% ---------------------   -----------------------    --------- Sm (anti-Smith)          SLE                        15 - 30% ---------------------   -----------------------    --------- RNP                      Mixed Connective Tissue                         Disease                         95% (U1 nRNP,                SLE                        30 -  50% anti-ribonucleoprotein)  Polymyositis and/or                         Dermatomyositis                 20% ---------------------   ------------------------   --------- Scl-70 (antiDNA          Scleroderma (diffuse)      20 - 35% topoisomerase)  Crest                           13% ---------------------   ------------------------   --------- Jo-1                     Polymyositis and/or                         Dermatomyositis            20 - 40% ---------------------   ------------------------   --------- Centromere B             Scleroderma -                           Crest                         variant                         80% Performed At: [facility] [address] [place], [place] [id number] Jolene Schimke MD [id number]    Copper 02/01/2023 82  69 - 132 ug/dL Final   Comment: (NOTE) This test was developed and its performance characteristics determined by [facility]. It has not been cleared or approved by the Food and Drug Administration.                                Detection Limit = 5 Performed At: [facility] [address] [place], [place] [id number] Jolene Schimke MD [id number]    Retic Ct Pct 02/01/2023 0.9  0.4 - 3.1 % Final   RBC. 02/01/2023 5.29  4.22 - 5.81 MIL/uL Final   Retic Count, Absolute 02/01/2023 48.1  19.0 - 186.0 K/uL Final   Immature Retic Fract 02/01/2023 6.3  2.3 - 15.9 % Final   Performed at [facility], [address] [place], [place] [address]   Vitamin B-12 02/01/2023 2,389 (H)  180 - 914 pg/mL Final   Comment: RESULT CONFIRMED BY MANUAL DILUTION (NOTE) This assay is not validated for testing neonatal or myeloproliferative syndrome specimens for Vitamin B12 levels. Performed at [facility], [address], [place], [place] [address]    Zinc 02/01/2023 80  44 - 115 ug/dL Final   Comment: (NOTE) This test was developed and its performance  characteristics determined by [facility]. It has not been cleared or approved by the Food and Drug Administration.                                Detection Limit = 5 Performed At: [facility] [address] [place], [place] [id number] Jolene Schimke MD [id number]    Folate 02/01/2023 9.9  >5.9 ng/mL Final   Performed at [facility], [address], [place], [place] [address]   Sodium 02/01/2023 138  135 - 145 mmol/L Final   Potassium 02/01/2023 4.0  3.5 - 5.1 mmol/L Final   Chloride 02/01/2023 101  98 - 111 mmol/L Final   CO2 02/01/2023 31  22 - 32 mmol/L Final   Glucose, Bld 02/01/2023 78  70 - 99 mg/dL Final   Glucose reference range applies only to  samples taken after fasting for at least 8 hours.   BUN 02/01/2023 13  6 - 20 mg/dL Final   Creatinine 16/04/9603 1.13  0.61 - 1.24 mg/dL Final   Calcium 54/03/8118 10.0  8.9 - 10.3 mg/dL Final   Total Protein 14/78/2956 8.2 (H)  6.5 - 8.1 g/dL Final   Albumin 21/30/8657 4.9  3.5 - 5.0 g/dL Final   AST 84/69/6295 20  15 - 41 U/L Final   ALT 02/01/2023 21  0 - 44 U/L Final   Alkaline Phosphatase 02/01/2023 71  38 - 126 U/L Final   Total Bilirubin 02/01/2023 0.8  0.3 - 1.2 mg/dL Final   GFR, Estimated 02/01/2023 >60  >60 mL/min Final   Comment: (NOTE) Calculated using the CKD-EPI Creatinine Equation (2021)    Anion gap 02/01/2023 6  5 - 15 Final   Performed at [facility], [address] [place], [place] [address]   WBC Count 02/01/2023 3.4 (L)  4.0 - 10.5 K/uL Final   RBC 02/01/2023 5.28  4.22 - 5.81 MIL/uL Final   Hemoglobin 02/01/2023 15.0  13.0 - 17.0 g/dL Final   HCT 24/40/1027 45.5  39.0 - 52.0 % Final   MCV 02/01/2023 86.2  80.0 - 100.0 fL Final   MCH 02/01/2023 28.4  26.0 - 34.0 pg Final   MCHC 02/01/2023 33.0  30.0 - 36.0 g/dL Final   RDW 25/36/6440 13.2  11.5 - 15.5 % Final   Platelet Count 02/01/2023 180  150 - 400 K/uL Final   nRBC 02/01/2023 0.0  0.0 - 0.2 % Final    Neutrophils Relative % 02/01/2023 55  % Final   Neutro Abs 02/01/2023 1.9  1.7 - 7.7 K/uL Final   Lymphocytes Relative 02/01/2023 27  % Final   Lymphs Abs 02/01/2023 0.9  0.7 - 4.0 K/uL Final   Monocytes Relative 02/01/2023 10  % Final   Monocytes Absolute 02/01/2023 0.3  0.1 - 1.0 K/uL Final   Eosinophils Relative 02/01/2023 7  % Final   Eosinophils Absolute 02/01/2023 0.2  0.0 - 0.5 K/uL Final   Basophils Relative 02/01/2023 1  % Final   Basophils Absolute 02/01/2023 0.0  0.0 - 0.1 K/uL Final   Immature Granulocytes 02/01/2023 0  % Final   Abs Immature Granulocytes 02/01/2023 0.00  0.00 - 0.07 K/uL Final   Performed at [facility], [address] [place], [place] [address]    RADIOGRAPHIC STUDIES: I have personally reviewed the radiological images as listed and agree with the findings in the report  No results found.  ASSESSMENT/PLAN 62 year old male referred for evaluation of mild granulocytopenia.  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 Storage Disease ELA2 mutation ELANE related neutropenia Kostman  Shawachman-Diamond Syndrome Barth Syndrome Storage diseases GATA2 deficiency   Infectious  Viral Hepatitis A, B, C CMV, EBV, Parvovirus   Bacterial  Tuberculosis Burcellosis [place] Typhoid   Rikettsial [place] Spotted fever Erlichiosis   Fungal Histoplasmosis  Autoimmune Primary Autoimmune    Thymoma SLE, RA Felty's syndrome Crohn's Disease Evans Syndrome   Nutritional Deficiencies B12 Folate Copper   Hematologic Malignancies T-cell LGL Myeloid neoplasms PNH   Bone marrow failure Aplastic anemia PNH    Initial evaluation:  CBC with diff, CMP, Morphology smear, ANA, anti-ds DNA, RF, B12, folate, copper, ESR.  Consider Hepatitis  ABC, HIV, flow for lymphoma including LGL  leukemia.  Quantiferon Gold.  PCR for Parvo-B19, Serologies for Brucellosis, RMSF, Erlichiosis, Histoplasmosis, Typhus, Histoplasmosis.    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    No problem-specific Assessment & Plan notes found for this encounter.    Orders Placed This Encounter  Procedures   CBC with Differential (Cancer Center Only)    Standing Status:   Future    Number of Occurrences:   1    Standing Expiration Date:   02/01/2024   CMP (Cancer Center only)    Standing Status:   Future    Number of Occurrences:   1    Standing Expiration Date:   02/01/2024   Folate    Standing Status:   Future    Number of Occurrences:   1    Standing Expiration Date:   02/01/2024   Zinc    Standing Status:   Future    Number of Occurrences:   1    Standing Expiration Date:   02/01/2024   Vitamin B12    Standing Status:   Future    Number of Occurrences:   1    Standing Expiration Date:   02/01/2024   Reticulocytes    Standing Status:   Future    Number of Occurrences:   1    Standing Expiration Date:   02/01/2024   Copper, serum    Standing Status:   Future    Number of Occurrences:   1    Standing Expiration Date:   02/01/2024   ANA Comprehensive Panel    Standing Status:   Future    Number of Occurrences:   1    Standing Expiration Date:   02/01/2024   Rheumatoid factor    Standing Status:   Future    Number of Occurrences:   1    Standing Expiration Date:   02/01/2024   Hepatitis panel, acute    Standing Status:   Future    Number of Occurrences:   1    Standing Expiration Date:   02/01/2024    45  minutes was spent in patient care.  This included time spent preparing to see the patient (e.g., review of tests), obtaining and/or reviewing separately obtained history, counseling and educating the patient/family/caregiver, ordering medications, tests, or procedures; documenting  clinical information in the electronic or other health record, independently interpreting results and communicating results to the patient/family/caregiver as well as coordination of care.       All questions were answered. The patient knows to call the clinic with any problems, questions or concerns.  This note was electronically signed.    Loni Muse, MD  02/28/2023 3:08 PM

## 2023-02-01 ENCOUNTER — Inpatient Hospital Stay: Payer: No Typology Code available for payment source

## 2023-02-01 ENCOUNTER — Other Ambulatory Visit: Payer: Self-pay

## 2023-02-01 ENCOUNTER — Inpatient Hospital Stay: Payer: No Typology Code available for payment source | Attending: Oncology | Admitting: Oncology

## 2023-02-01 VITALS — BP 118/83 | HR 59 | Temp 97.8°F | Resp 18 | Ht 74.0 in | Wt 149.7 lb

## 2023-02-01 DIAGNOSIS — D709 Neutropenia, unspecified: Secondary | ICD-10-CM | POA: Diagnosis present

## 2023-02-01 LAB — CBC WITH DIFFERENTIAL (CANCER CENTER ONLY)
Abs Immature Granulocytes: 0 10*3/uL (ref 0.00–0.07)
Basophils Absolute: 0 10*3/uL (ref 0.0–0.1)
Basophils Relative: 1 %
Eosinophils Absolute: 0.2 10*3/uL (ref 0.0–0.5)
Eosinophils Relative: 7 %
HCT: 45.5 % (ref 39.0–52.0)
Hemoglobin: 15 g/dL (ref 13.0–17.0)
Immature Granulocytes: 0 %
Lymphocytes Relative: 27 %
Lymphs Abs: 0.9 10*3/uL (ref 0.7–4.0)
MCH: 28.4 pg (ref 26.0–34.0)
MCHC: 33 g/dL (ref 30.0–36.0)
MCV: 86.2 fL (ref 80.0–100.0)
Monocytes Absolute: 0.3 10*3/uL (ref 0.1–1.0)
Monocytes Relative: 10 %
Neutro Abs: 1.9 10*3/uL (ref 1.7–7.7)
Neutrophils Relative %: 55 %
Platelet Count: 180 10*3/uL (ref 150–400)
RBC: 5.28 MIL/uL (ref 4.22–5.81)
RDW: 13.2 % (ref 11.5–15.5)
WBC Count: 3.4 10*3/uL — ABNORMAL LOW (ref 4.0–10.5)
nRBC: 0 % (ref 0.0–0.2)

## 2023-02-01 LAB — VITAMIN B12: Vitamin B-12: 2389 pg/mL — ABNORMAL HIGH (ref 180–914)

## 2023-02-01 LAB — CMP (CANCER CENTER ONLY)
ALT: 21 U/L (ref 0–44)
AST: 20 U/L (ref 15–41)
Albumin: 4.9 g/dL (ref 3.5–5.0)
Alkaline Phosphatase: 71 U/L (ref 38–126)
Anion gap: 6 (ref 5–15)
BUN: 13 mg/dL (ref 6–20)
CO2: 31 mmol/L (ref 22–32)
Calcium: 10 mg/dL (ref 8.9–10.3)
Chloride: 101 mmol/L (ref 98–111)
Creatinine: 1.13 mg/dL (ref 0.61–1.24)
GFR, Estimated: >60 mL/min (ref >60)
Glucose, Bld: 78 mg/dL (ref 70–99)
Potassium: 4 mmol/L (ref 3.5–5.1)
Sodium: 138 mmol/L (ref 135–145)
Total Bilirubin: 0.8 mg/dL (ref 0.3–1.2)
Total Protein: 8.2 g/dL — ABNORMAL HIGH (ref 6.5–8.1)

## 2023-02-01 LAB — HEPATITIS PANEL, ACUTE
HCV Ab: NONREACTIVE
Hep A IgM: NONREACTIVE
Hep B C IgM: NONREACTIVE
Hepatitis B Surface Ag: NONREACTIVE

## 2023-02-01 LAB — FOLATE: Folate: 9.9 ng/mL (ref >5.9)

## 2023-02-01 LAB — RETICULOCYTES
Immature Retic Fract: 6.3 % (ref 2.3–15.9)
RBC.: 5.29 MIL/uL (ref 4.22–5.81)
Retic Count, Absolute: 48.1 10*3/uL (ref 19.0–186.0)
Retic Ct Pct: 0.9 % (ref 0.4–3.1)

## 2023-02-21 NOTE — Progress Notes (Signed)
[facility] Cancer Initial Visit:  Patient Care Team: [name], L.August Saucer, MD as PCP - General (Family Medicine) Loni Muse, MD as Consulting Physician (Hematology)  CHIEF COMPLAINTS/PURPOSE OF CONSULTATION: HISTORY OF PRESENTING ILLNESS:  Corey Chavez 62 y.o. male is here because of leukopenia Medical history notable for eczema, colon polyps, vitamin D deficiency  Nov 15, 2022: B12 272 ferritin 76 HIV negative  December 25, 2022: WBC 3.1 hemoglobin 13.1 MCV 85 platelet count 167; 48 segs 28 lymphs 12 monos 10 eos 2 basos.  ANC 1.5  February 01 2023:  [facility] Hematology Patient states that over the last year has noted increased fatigue and skin feels hot to touch.  No history of recurrent infections.    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  Tobacco none,  EtOH none  FMH Mother died 93 MDS Father died 66 prostate cancer Brother died 33 ESRD due to HTN Brother alive 75 well Brother alive 66 well  Brother alive 89 well Sister alive 35 well  WBC 3.4 hemoglobin 15.0 MCV 86 platelet count 180; 55 segs 27 lymphs 10 monos 7 eos 1 basophil.  ANC 1.9 reticulocyte count 0.9 ANA panel negative.  Rheumatoid factor negative Hepatitis ABC serologies negative Folate 9.9 Copper 82 zinc 80 B12 2389 CMP notable for total protein 8.2  February 22, 2023: Scheduled follow-up for leukopenia.  Reviewed results of labs with patient.    Review of Systems  Constitutional:  Negative for appetite change, chills, fatigue, fever and unexpected weight change.  HENT:   Negative for mouth sores, nosebleeds, sore throat, trouble swallowing and voice change.   Eyes:  Negative for eye problems and icterus.       Vision changes:  None  Respiratory:  Negative for chest tightness, cough, hemoptysis, shortness of breath and wheezing.        PND:  none Orthopnea:  none DOE:    Cardiovascular:  Negative for chest pain and leg swelling.       Has experienced palpitations in the past but not in 6  months  Gastrointestinal:  Negative for abdominal pain, blood in stool, diarrhea, nausea and vomiting.       Has occasional anal pruritus.  Has undergone colonoscopy in the past.  Occasional constipation  Endocrine: Negative for hot flashes.       Cold intolerance:  none Heat intolerance:  none  Genitourinary:  Negative for bladder incontinence, difficulty urinating, dysuria, frequency, hematuria and nocturia.   Musculoskeletal:  Negative for arthralgias, back pain, gait problem, myalgias, neck pain and neck stiffness.  Skin:  Negative for itching, rash and wound.  Neurological:  Negative for dizziness, extremity weakness, gait problem, headaches, numbness, seizures and speech difficulty.  Hematological:  Negative for adenopathy. Does not bruise/bleed easily.  Psychiatric/Behavioral:  Negative for sleep disturbance and suicidal ideas. The patient is not nervous/anxious.        Does not sleep enough    MEDICAL HISTORY: Past Medical History:  Diagnosis Date   Hemorrhoids     SURGICAL HISTORY: No past surgical history on file.  SOCIAL HISTORY: Social History   Socioeconomic History   Marital status: Unknown    Spouse name: Not on file   Number of children: Not on file   Years of education: Not on file   Highest education level: Not on file  Occupational History   Not on file  Tobacco Use   Smoking status: Never   Smokeless tobacco: Never  Substance and Sexual Activity  Alcohol use: Not on file   Drug use: Not on file   Sexual activity: Not on file  Other Topics Concern   Not on file  Social History Narrative   Not on file   Social Determinants of Health   Financial Resource Strain: Not on file  Food Insecurity: Not on file  Transportation Needs: Not on file  Physical Activity: Not on file  Stress: Not on file  Social Connections: Not on file  Intimate Partner Violence: Not on file    FAMILY HISTORY No family history on file.  ALLERGIES:  has No Known  Allergies.  MEDICATIONS:  Current Outpatient Medications  Medication Sig Dispense Refill   hydrocortisone (ANUSOL-HC) 2.5 % rectal cream Place rectally 2 (two) times daily as needed. (Patient not taking: Reported on 02/01/2023)     predniSONE (DELTASONE) 20 MG tablet Take by mouth. (Patient not taking: Reported on 02/01/2023)     triamcinolone cream (KENALOG) 0.1 % SMARTSIG:Sparingly Topical Twice Daily PRN (Patient not taking: Reported on 02/01/2023)     Vitamin D, Ergocalciferol, (DRISDOL) 1.25 MG (50000 UNIT) CAPS capsule Take 50,000 Units by mouth once a week.     No current facility-administered medications for this visit.    PHYSICAL EXAMINATION:  ECOG PERFORMANCE STATUS: 0 - Asymptomatic   Vitals:   02/22/23 1358  BP: 108/80  Pulse: 67  Resp: 14  Temp: (!) 97.5 F (36.4 C)  SpO2: 100%    Filed Weights   02/22/23 1358  Weight: 148 lb 4.8 oz (67.3 kg)     Physical Exam Vitals and nursing note reviewed.  Constitutional:      Appearance: Normal appearance. He is not diaphoretic.  HENT:     Head: Normocephalic and atraumatic.     Right Ear: External ear normal.     Left Ear: External ear normal.     Nose: Nose normal.  Eyes:     Conjunctiva/sclera: Conjunctivae normal.     Pupils: Pupils are equal, round, and reactive to light.  Cardiovascular:     Rate and Rhythm: Normal rate and regular rhythm.     Heart sounds:     No friction rub. No gallop.  Musculoskeletal:     Cervical back: Normal range of motion and neck supple. No rigidity or tenderness.  Lymphadenopathy:     Head:     Right side of head: No submental, submandibular, tonsillar, preauricular, posterior auricular or occipital adenopathy.     Left side of head: No submental, submandibular, tonsillar, preauricular, posterior auricular or occipital adenopathy.     Cervical: No cervical adenopathy.     Right cervical: No superficial, deep or posterior cervical adenopathy.    Left cervical: No superficial,  deep or posterior cervical adenopathy.     Upper Body:     Right upper body: No supraclavicular or axillary adenopathy.     Left upper body: No supraclavicular or axillary adenopathy.     Lower Body: No right inguinal adenopathy. No left inguinal adenopathy.  Skin:    Coloration: Skin is not jaundiced.  Neurological:     General: No focal deficit present.     Mental Status: He is alert and oriented to person, place, and time. Mental status is at baseline.  Psychiatric:        Mood and Affect: Mood normal.        Behavior: Behavior normal.        Thought Content: Thought content normal.  Judgment: Judgment normal.      LABORATORY DATA: I have personally reviewed the data as listed:  Appointment on 02/01/2023  Component Date Value Ref Range Status   Hepatitis B Surface Ag 02/01/2023 NON REACTIVE  NON REACTIVE Final   HCV Ab 02/01/2023 NON REACTIVE  NON REACTIVE Final   Comment: (NOTE) Nonreactive HCV antibody screen is consistent with no HCV infections,  unless recent infection is suspected or other evidence exists to indicate HCV infection.     Hep A IgM 02/01/2023 NON REACTIVE  NON REACTIVE Final   Hep B C IgM 02/01/2023 NON REACTIVE  NON REACTIVE Final   Performed at [facility], [address] [place], [place] [address]   Rheumatoid fact SerPl-aCnc 02/01/2023 11.7  <14.0 IU/mL Final   Comment: (NOTE) Performed At: [facility] [address] [place], [place] [id number] Jolene Schimke MD [id number]    ds DNA Ab 02/01/2023 <1  0 - 9 IU/mL Final   Comment: (NOTE)                                   Negative      <5                                   Equivocal  5 - 9                                   Positive      >9    Ribonucleic Protein 02/01/2023 <0.2  0.0 - 0.9 AI Final   ENA SM Ab Ser-aCnc 02/01/2023 <0.2  0.0 - 0.9 AI Final   Scleroderma (Scl-70) (ENA) Antibod* 02/01/2023 <0.2  0.0 - 0.9 AI Final   SSA (Ro) (ENA) Antibody, IgG 02/01/2023  <0.2  0.0 - 0.9 AI Final   SSB (La) (ENA) Antibody, IgG 02/01/2023 <0.2  0.0 - 0.9 AI Final   Chromatin Ab SerPl-aCnc 02/01/2023 <0.2  0.0 - 0.9 AI Final   Anti JO-1 02/01/2023 <0.2  0.0 - 0.9 AI Final   Centromere Ab Screen 02/01/2023 <0.2  0.0 - 0.9 AI Final   See below: 02/01/2023 Comment   Final   Comment: (NOTE) Autoantibody                       Disease Association ------------------------------------------------------------                        Condition                  Frequency ---------------------   ------------------------   --------- Antinuclear Antibody,    SLE, mixed connective Direct (ANA-D)           tissue diseases ---------------------   ------------------------   --------- dsDNA                    SLE                        40 - 60% ---------------------   ------------------------   --------- Chromatin                Drug induced SLE  90%                         SLE                        48 - 97% ---------------------   ------------------------   --------- SSA (Ro)                 SLE                        25 - 35%                         Sjogren's Syndrome         40 - 70%                         Neonatal Lupus                 100% ---------------------   ------------------------   --------- SSB (La)                 SLE                                                       10%                         Sjogren's Syndrome              30% ---------------------   -----------------------    --------- Sm (anti-Smith)          SLE                        15 - 30% ---------------------   -----------------------    --------- RNP                      Mixed Connective Tissue                         Disease                         95% (U1 nRNP,                SLE                        30 - 50% anti-ribonucleoprotein)  Polymyositis and/or                         Dermatomyositis                 20% ---------------------   ------------------------    --------- Scl-70 (antiDNA          Scleroderma (diffuse)      20 - 35% topoisomerase)           Crest                           13% ---------------------   ------------------------   --------- Jo-1  Polymyositis and/or                         Dermatomyositis            20 - 40% ---------------------   ------------------------   --------- Centromere B             Scleroderma -                           Crest                         variant                         80% Performed At: [facility] [address] [place], [place] [id number] Jolene Schimke MD [id number]    Copper 02/01/2023 82  69 - 132 ug/dL Final   Comment: (NOTE) This test was developed and its performance characteristics determined by [facility]. It has not been cleared or approved by the Food and Drug Administration.                                Detection Limit = 5 Performed At: [facility] [address] [place], [place] [id number] Jolene Schimke MD [id number]    Retic Ct Pct 02/01/2023 0.9  0.4 - 3.1 % Final   RBC. 02/01/2023 5.29  4.22 - 5.81 MIL/uL Final   Retic Count, Absolute 02/01/2023 48.1  19.0 - 186.0 K/uL Final   Immature Retic Fract 02/01/2023 6.3  2.3 - 15.9 % Final   Performed at [facility], [address], [place], [place] [address]   Vitamin B-12 02/01/2023 2,389 (H)  180 - 914 pg/mL Final   Comment: RESULT CONFIRMED BY MANUAL DILUTION (NOTE) This assay is not validated for testing neonatal or myeloproliferative syndrome specimens for Vitamin B12 levels. Performed at [facility], [address] [place], [place] [address]    Zinc 02/01/2023 80  44 - 115 ug/dL Final   Comment: (NOTE) This test was developed and its performance characteristics determined by [facility]. It has not been cleared or approved by the Food and Drug Administration.                                Detection Limit = 5 Performed At: [facility] [address] [place], [place] [id number] Jolene Schimke MD [id number]    Folate 02/01/2023 9.9  >5.9 ng/mL Final   Performed at [facility], [address] [place], [place] [address]   Sodium 02/01/2023 138  135 - 145 mmol/L Final   Potassium 02/01/2023 4.0  3.5 - 5.1 mmol/L Final   Chloride 02/01/2023 101  98 - 111 mmol/L Final   CO2 02/01/2023 31  22 - 32 mmol/L Final   Glucose, Bld 02/01/2023 78  70 - 99 mg/dL Final   Glucose reference range applies only to samples taken after fasting for at least 8 hours.   BUN 02/01/2023 13  6 - 20 mg/dL Final   Creatinine 29/51/8841 1.13  0.61 - 1.24 mg/dL Final   Calcium 66/12/3014 10.0  8.9 - 10.3 mg/dL Final   Total Protein 07/10/3233 8.2 (H)  6.5 - 8.1 g/dL Final  Albumin 02/01/2023 4.9  3.5 - 5.0 g/dL Final   AST 54/03/8118 20  15 - 41 U/L Final   ALT 02/01/2023 21  0 - 44 U/L Final   Alkaline Phosphatase 02/01/2023 71  38 - 126 U/L Final   Total Bilirubin 02/01/2023 0.8  0.3 - 1.2 mg/dL Final   GFR, Estimated 02/01/2023 >60  >60 mL/min Final   Comment: (NOTE) Calculated using the CKD-EPI Creatinine Equation (2021)    Anion gap 02/01/2023 6  5 - 15 Final   Performed at [facility], [address], [place], [place] [address]   WBC Count 02/01/2023 3.4 (L)  4.0 - 10.5 K/uL Final   RBC 02/01/2023 5.28  4.22 - 5.81 MIL/uL Final   Hemoglobin 02/01/2023 15.0  13.0 - 17.0 g/dL Final   HCT 95/62/1308 45.5  39.0 - 52.0 % Final   MCV 02/01/2023 86.2  80.0 - 100.0 fL Final   MCH 02/01/2023 28.4  26.0 - 34.0 pg Final   MCHC 02/01/2023 33.0  30.0 - 36.0 g/dL Final   RDW 65/78/4696 13.2  11.5 - 15.5 % Final   Platelet Count 02/01/2023 180  150 - 400 K/uL Final   nRBC 02/01/2023 0.0  0.0 - 0.2 % Final   Neutrophils Relative % 02/01/2023 55  % Final   Neutro Abs 02/01/2023 1.9  1.7 - 7.7 K/uL Final   Lymphocytes Relative 02/01/2023 27  % Final   Lymphs Abs 02/01/2023 0.9  0.7 -  4.0 K/uL Final   Monocytes Relative 02/01/2023 10  % Final   Monocytes Absolute 02/01/2023 0.3  0.1 - 1.0 K/uL Final   Eosinophils Relative 02/01/2023 7  % Final   Eosinophils Absolute 02/01/2023 0.2  0.0 - 0.5 K/uL Final   Basophils Relative 02/01/2023 1  % Final   Basophils Absolute 02/01/2023 0.0  0.0 - 0.1 K/uL Final   Immature Granulocytes 02/01/2023 0  % Final   Abs Immature Granulocytes 02/01/2023 0.00  0.00 - 0.07 K/uL Final   Performed at [facility], [address] [place], [place] [address]    RADIOGRAPHIC STUDIES: I have personally reviewed the radiological images as listed and agree with the findings in the report  No results found.  ASSESSMENT/PLAN   62 year old male referred for evaluation of mild granulocytopenia.  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 related neutropenia Kostman  Shawachman-Diamond Syndrome Barth Syndrome Storage diseases GATA2 deficiency    Infectious   Viral CMV, EBV, Parvovirus   Bacterial   Tuberculosis Burcellosis [place] Typhoid   Rikettsial [place] Spotted fever Erlichiosis   Fungal Histoplasmosis  Autoimmune Primary Autoimmune      Thymoma    Hematologic Malignancies T-cell LGL Myeloid neoplasms [facility]    Bone marrow failure [facility]      Since the patient appears well and lab testing does not demonstrate evidence of significant abnormalities in the CBC, 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 It is most commonly seen in those of African ancestry. In addition, ANC reference ranges from countries in [place] emphasize that ANC levels <1500  cells/mL are common and harmless.  The lower ANC levels are driven by the Duffy null [Fy(a-b-)] phenotype, which is protective against malaria and seen in 80% to 100% of those of sub-Saharan African ancestry and <1% of those of European descent. Benign ethnic neutropenia is clinically insignificant, but the average ANC values differ from what are typically seen in those of European descent.    Cancer Staging  No matching staging information was found for the patient.    No problem-specific Assessment & Plan notes found for this encounter.    No orders of the defined types were placed in this encounter.   30  minutes was spent in patient care.  This included time spent preparing to see the patient (e.g., review of tests), obtaining and/or reviewing separately obtained history, counseling and educating the patient/family/caregiver, ordering medications, tests, or procedures; documenting clinical information in the electronic or other health record, independently interpreting results and communicating results to the patient/family/caregiver as well as coordination of care.       All questions were answered. The patient knows to call the clinic with any problems, questions or concerns.  This note was electronically signed.    Loni Muse, MD  03/18/2023 12:57 PM

## 2023-02-22 ENCOUNTER — Inpatient Hospital Stay (HOSPITAL_BASED_OUTPATIENT_CLINIC_OR_DEPARTMENT_OTHER): Payer: No Typology Code available for payment source | Admitting: Oncology

## 2023-02-22 VITALS — BP 108/80 | HR 67 | Temp 97.5°F | Resp 14 | Ht 74.0 in | Wt 148.3 lb

## 2023-02-22 DIAGNOSIS — D708 Other neutropenia: Secondary | ICD-10-CM

## 2023-02-22 DIAGNOSIS — D709 Neutropenia, unspecified: Secondary | ICD-10-CM | POA: Diagnosis not present

## 2023-03-18 DIAGNOSIS — D708 Other neutropenia: Secondary | ICD-10-CM | POA: Insufficient documentation
